# Patient Record
Sex: Male | Born: 1955 | Race: Black or African American | Hispanic: No | Marital: Married | Smoking: Never smoker
Health system: Southern US, Community
[De-identification: ages and names within clinical notes are randomized; demographics above are authoritative.]

## PROBLEM LIST (undated history)

## (undated) DIAGNOSIS — I10 Essential (primary) hypertension: Secondary | ICD-10-CM

## (undated) HISTORY — DX: Essential (primary) hypertension: I10

---

## 2006-06-02 ENCOUNTER — Emergency Department (HOSPITAL_COMMUNITY): Admission: EM | Admit: 2006-06-02 | Discharge: 2006-06-02 | Payer: Self-pay | Admitting: Emergency Medicine

## 2006-12-04 ENCOUNTER — Emergency Department (HOSPITAL_COMMUNITY): Admission: EM | Admit: 2006-12-04 | Discharge: 2006-12-04 | Payer: Self-pay | Admitting: Emergency Medicine

## 2007-06-10 ENCOUNTER — Emergency Department (HOSPITAL_COMMUNITY): Admission: EM | Admit: 2007-06-10 | Discharge: 2007-06-10 | Payer: Self-pay | Admitting: Emergency Medicine

## 2007-10-25 ENCOUNTER — Emergency Department (HOSPITAL_COMMUNITY): Admission: EM | Admit: 2007-10-25 | Discharge: 2007-10-25 | Payer: Self-pay | Admitting: Emergency Medicine

## 2009-10-16 ENCOUNTER — Emergency Department (HOSPITAL_COMMUNITY): Admission: EM | Admit: 2009-10-16 | Discharge: 2009-10-16 | Payer: Self-pay | Admitting: Emergency Medicine

## 2010-12-23 LAB — CBC
HCT: 39.2 % (ref 39.0–52.0)
Hemoglobin: 13.6 g/dL (ref 13.0–17.0)
MCHC: 34.6 g/dL (ref 30.0–36.0)
MCV: 94.9 fL (ref 78.0–100.0)
RBC: 4.13 MIL/uL — ABNORMAL LOW (ref 4.22–5.81)
RDW: 13.4 % (ref 11.5–15.5)
WBC: 7.3 10*3/uL (ref 4.0–10.5)

## 2010-12-23 LAB — DIFFERENTIAL
Basophils Absolute: 0 10*3/uL (ref 0.0–0.1)
Lymphocytes Relative: 14 % (ref 12–46)
Monocytes Absolute: 0.7 10*3/uL (ref 0.1–1.0)

## 2010-12-23 LAB — POCT I-STAT, CHEM 8
BUN: 13 mg/dL (ref 6–23)
Chloride: 101 mEq/L (ref 96–112)
Creatinine, Ser: 1.5 mg/dL (ref 0.4–1.5)
Glucose, Bld: 104 mg/dL — ABNORMAL HIGH (ref 70–99)
HCT: 43 % (ref 39.0–52.0)
Potassium: 4.3 mEq/L (ref 3.5–5.1)

## 2011-01-29 ENCOUNTER — Inpatient Hospital Stay (HOSPITAL_COMMUNITY)
Admission: EM | Admit: 2011-01-29 | Discharge: 2011-01-31 | DRG: 143 | Disposition: A | Discharge status: Home / Self Care | Payer: BC Managed Care – PPO | Attending: Internal Medicine | Admitting: Internal Medicine

## 2011-01-29 ENCOUNTER — Emergency Department (HOSPITAL_COMMUNITY): Payer: BC Managed Care – PPO

## 2011-01-29 DIAGNOSIS — Z860100 Personal history of colon polyps, unspecified: Secondary | ICD-10-CM

## 2011-01-29 DIAGNOSIS — F17200 Nicotine dependence, unspecified, uncomplicated: Secondary | ICD-10-CM | POA: Diagnosis present

## 2011-01-29 DIAGNOSIS — E669 Obesity, unspecified: Secondary | ICD-10-CM | POA: Diagnosis present

## 2011-01-29 DIAGNOSIS — R0789 Other chest pain: Principal | ICD-10-CM | POA: Diagnosis present

## 2011-01-29 DIAGNOSIS — I10 Essential (primary) hypertension: Secondary | ICD-10-CM | POA: Diagnosis present

## 2011-01-29 DIAGNOSIS — E785 Hyperlipidemia, unspecified: Secondary | ICD-10-CM | POA: Diagnosis present

## 2011-01-29 LAB — DIFFERENTIAL
Basophils Absolute: 0.1 10*3/uL (ref 0.0–0.1)
Basophils Relative: 1 % (ref 0–1)
Eosinophils Absolute: 0.2 10*3/uL (ref 0.0–0.7)
Eosinophils Relative: 3 % (ref 0–5)
Lymphs Abs: 2.7 10*3/uL (ref 0.7–4.0)
Monocytes Relative: 5 % (ref 3–12)
Neutrophils Relative %: 44 % (ref 43–77)

## 2011-01-29 LAB — CBC
MCH: 31.8 pg (ref 26.0–34.0)
MCV: 93.2 fL (ref 78.0–100.0)
WBC: 5.7 10*3/uL (ref 4.0–10.5)

## 2011-01-29 LAB — LIPID PANEL
LDL Cholesterol: 156 mg/dL — ABNORMAL HIGH (ref 0–99)
Total CHOL/HDL Ratio: 5.3 RATIO
Triglycerides: 222 mg/dL — ABNORMAL HIGH (ref <150)

## 2011-01-29 LAB — COMPREHENSIVE METABOLIC PANEL
Albumin: 3.9 g/dL (ref 3.5–5.2)
Alkaline Phosphatase: 58 U/L (ref 39–117)
CO2: 27 mEq/L (ref 19–32)
Calcium: 9 mg/dL (ref 8.4–10.5)
GFR calc Af Amer: >60 mL/min (ref >60)
Glucose, Bld: 96 mg/dL (ref 70–99)
Total Protein: 6.8 g/dL (ref 6.0–8.3)

## 2011-01-29 LAB — POCT CARDIAC MARKERS: Myoglobin, poc: 175 ng/mL (ref 12–200)

## 2011-01-29 NOTE — H&P (Signed)
NAME:  KADARIOUS, DIKES NO.:  [id number]  MEDICAL RECORD NO.:  [id number]           PATIENT TYPE:  E  LOCATION:  [facility]                         FACILITY:  [facility]  PHYSICIAN:  Talmage Nap, MD  DATE OF BIRTH:  17-Aug-1956  DATE OF ADMISSION:  01/29/2011 DATE OF DISCHARGE:                             HISTORY & PHYSICAL   PRIMARY CARE PHYSICIAN:  Unassigned.  History obtainable from the patient and the patient's sister.  CHIEF COMPLAINT:  Chest pain at rest, which started at about 2:30 p.m. at work today, January 29, 2011.  The patient is a 55 year old obese very pleasant African American male with history of hypertension, hyperlipidemia presenting to the emergency room with sudden onset of chest pain, which started at work.  The patient claimed [place] and suddenly developed chest pain located in the precordia as well as in the recess sternal region.  Pain was described as pressure-like, 10/10 in intensity, and was radiating to the left arm.  It was said to be associated with shortness of breath.  The patient however denied any history of diaphoresis.  He denied any nausea.  He denied any vomiting. He denied any headaches.  He denied any fever.  He denied any chills. Denied any rigor.  The chest pain however persisted and the patient presented to the emergency room.  In the ED, the patient was given 2 tablets of nitro and the pain resolved.  The patient claimed that this is about a third episode of the chest pain.  First episode was about 4 months ago and the third episode was when he was in the emergency room awaiting evaluation.  After evaluating the patient, he was advised to be admitted for further workup and stabilization.  PAST MEDICAL HISTORY:  Positive for, 1. Hypertension. 2. Hyperlipidemia. 3. Obesity.  PAST SURGICAL HISTORY: 1. Ventral hernia repair. 2. Colonoscopy status post  polypectomy.  PREADMISSION MEDS: 1. Hydrochlorothiazide 25 mg one p.o. daily. 2. Prilosec (omeprazole) 10 mg over-the-counter one p.o. daily. 3. Prozac (fluoxetine) 20 mg p.o. daily.  He has no known allergies.  SOCIAL HISTORY:  The patient smokes once a week for unspecified duration of time.  Occasionally takes alcohol.  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  REVIEW OF SYSTEMS:  The patient complained about headache, most likely secondary to the nitroglycerin that was given.  He denies any nausea or vomiting.  No fever.  No chills.  No rigor.  Chest pain has abated. Denies any cough.  No abdominal discomfort.  No diarrhea or hematochezia.  No dysuria or hematuria.  No swelling of the lower extremity.  No intolerance to heat or cold and no neuropsychiatric disorder.  PHYSICAL EXAMINATION:  GENERAL:  On examination, very pleasant middle- aged man, obese, not in any respiratory distress. VITAL SIGNS:  Blood pressure is 149/102, pulse is 89, respiratory rate 22, temperature is 98.8. HEENT:  Pupils are reactive to light and extraocular muscles are intact. NECK:  No jugular venous distention.  No carotid bruit.  No lymphadenopathy. CHEST:  Clear to auscultation. HEART:  Heart sounds are 1 and 2. ABDOMEN:  Soft, obese, and nontender.  Prominent suprapubic transverse scar.  Liver, spleen tip not palpable.  Bowel sounds are positive. EXTREMITIES:  No pedal edema. NEUROLOGIC:  Nonfocal. MUSCULOSKELETAL:  Unremarkable. SKIN:  Normal turgor.  LABORATORY DATA:  First set of cardiac markers; troponin-I less than 0.05, CK-MB less than 1.0.  Hematological indices showed WBC of 5.7, hemoglobin of 14.1, hematocrit of 41.4, MCV of 93.2 with a platelet count of 279.  Chemistry, most likely not done.  EKG showed normal sinus rhythm with no ST-wave changes.  Imaging studies done include chest x-ray, which was essentially  normal.  IMPRESSION: 1. Chest pain, questionable angina. 2. Hypertension. 3. Hyperlipidemia. 4. Obesity. 5. Family history of diabetes mellitus. 6. Recurrent episodes of chest pain.  PLAN:  To admit the patient to telemetry.  The patient will be saline locked.  He will be given nitroglycerin 0.4 mg sublingual p.r.n. for chest pain followed with Tylenol 100 mg p.o. t.i.d. p.r.n. for headache associated with the nitroglycerin.  He will be on O2 via nasal cannula 3 L per minute, aspirin 325 mg p.o. daily, morphine 2 mg IV q.4 p.r.n. for chest pain, Zocor 20 mg p.o. daily, lisinopril 10 mg p.o. daily, Lopressor 50 mg p.o. b.i.d., he will also be on Protonix 40 mg p.o. daily for GI prophylaxis and Lovenox 40 mg subcu q.24 for DVT prophylaxis.  Further labs to be ordered on this patient will include cardiac enzymes q.6 x3, lipid panel, hemoglobin A1c, thyroid panel, which include TSH, T3, and T4 and chemistries, CMP stat, will be repeated in a.m., CBCD will also be repeated in a.m., and the patient will also have a 2-D echo done.  Finally because of recurrent episodes of chest pain it is  important for the patient to have a Cardiolite stress test done in this admission. The patient will be followed and evaluated on day-to-day basis.     Talmage Nap, MD     CN/MEDQ  D:  01/29/2011  T:  01/29/2011  Job:  [id number]  Electronically Signed by Talmage Nap  on 01/29/2011 09:07:55 PM

## 2011-01-30 DIAGNOSIS — R079 Chest pain, unspecified: Secondary | ICD-10-CM

## 2011-01-30 DIAGNOSIS — R072 Precordial pain: Secondary | ICD-10-CM

## 2011-01-30 LAB — DIFFERENTIAL
Basophils Absolute: 0.1 10*3/uL (ref 0.0–0.1)
Basophils Relative: 1 % (ref 0–1)
Lymphocytes Relative: 46 % (ref 12–46)
Lymphs Abs: 3.3 10*3/uL (ref 0.7–4.0)
Monocytes Absolute: 0.5 10*3/uL (ref 0.1–1.0)
Monocytes Relative: 7 % (ref 3–12)
Neutro Abs: 3.2 10*3/uL (ref 1.7–7.7)
Neutrophils Relative %: 44 % (ref 43–77)

## 2011-01-30 LAB — COMPREHENSIVE METABOLIC PANEL
ALT: 32 U/L (ref 0–53)
Albumin: 3.7 g/dL (ref 3.5–5.2)
Alkaline Phosphatase: 54 U/L (ref 39–117)
GFR calc Af Amer: >60 mL/min (ref >60)
GFR calc non Af Amer: >60 mL/min (ref >60)
Total Bilirubin: 0.6 mg/dL (ref 0.3–1.2)
Total Protein: 6.5 g/dL (ref 6.0–8.3)

## 2011-01-30 LAB — HEMOGLOBIN A1C: Mean Plasma Glucose: 126 mg/dL — ABNORMAL HIGH (ref <117)

## 2011-01-30 LAB — TSH: TSH: 1.087 u[IU]/mL (ref 0.350–4.500)

## 2011-01-30 LAB — CBC
HCT: 39.9 % (ref 39.0–52.0)
RDW: 13 % (ref 11.5–15.5)

## 2011-01-30 LAB — CARDIAC PANEL(CRET KIN+CKTOT+MB+TROPI)
Relative Index: 0.4 (ref 0.0–2.5)
Total CK: 245 U/L — ABNORMAL HIGH (ref 7–232)
Total CK: 215 U/L (ref 7–232)
Troponin I: 0.02 ng/mL (ref 0.00–0.06)

## 2011-01-31 ENCOUNTER — Inpatient Hospital Stay (HOSPITAL_COMMUNITY): Payer: BC Managed Care – PPO

## 2011-01-31 DIAGNOSIS — R079 Chest pain, unspecified: Secondary | ICD-10-CM

## 2011-01-31 LAB — CBC
HCT: 40.5 % (ref 39.0–52.0)
MCH: 33.1 pg (ref 26.0–34.0)
MCV: 93.8 fL (ref 78.0–100.0)
Platelets: 290 10*3/uL (ref 150–400)
RDW: 13 % (ref 11.5–15.5)
WBC: 7.8 10*3/uL (ref 4.0–10.5)

## 2011-01-31 LAB — BASIC METABOLIC PANEL
BUN: 14 mg/dL (ref 6–23)
CO2: 28 mEq/L (ref 19–32)
Calcium: 9.6 mg/dL (ref 8.4–10.5)
Glucose, Bld: 101 mg/dL — ABNORMAL HIGH (ref 70–99)
Potassium: 4.3 mEq/L (ref 3.5–5.1)
Sodium: 139 mEq/L (ref 135–145)

## 2011-01-31 MED ORDER — TECHNETIUM TC 99M TETROFOSMIN IV KIT
10.0000 | PACK | Freq: Once | INTRAVENOUS | Status: AC | PRN
  Administered 2011-01-31: 10 via INTRAVENOUS

## 2011-01-31 MED ORDER — TECHNETIUM TC 99M TETROFOSMIN IV KIT
30.0000 | PACK | Freq: Once | INTRAVENOUS | Status: AC | PRN
  Administered 2011-01-31: 30 via INTRAVENOUS

## 2011-02-01 ENCOUNTER — Inpatient Hospital Stay (HOSPITAL_COMMUNITY): Payer: BC Managed Care – PPO

## 2011-02-12 NOTE — Consult Note (Signed)
NAME:  KENY, DONALD NO.:  [id number]  MEDICAL RECORD NO.:  [id number]           PATIENT TYPE:  I  LOCATION:  3740                         FACILITY:  [facility]  PHYSICIAN:  Vesta Mixer, M.D. DATE OF BIRTH:  06/29/1956  DATE OF CONSULTATION:  01/30/2011 DATE OF DISCHARGE:                                CONSULTATION   Prestyn is a 55 year old gentleman with a history of hypertension, hyperlipidemia, and obesity.  He was admitted yesterday with episodes of chest pain.  The patient reports having episodes of chest pain for the past several months.  These would typically occur several days after he worked out, that was more consistent with the muscular type pain.  Yesterday, he had a severe onset of this chest pain.  It tend to get worse if he sat up and twisted his torso.  There was a possible pleuritic component to the chest pain.  The patient also noticed some worsening when he got up and walked around.  He presented to the emergency room and is feeling a little bit better today.  The pain is described as a pressure-like sensation that also is very sharp.  He is tender around the left side of his chest.  It lasted intermittently for several hours.  CURRENT MEDICATIONS: 1. Hydrochlorothiazide 25 mg a day. 2. Prilosec 10 mg over the counter once a day. 3. Prozac 20 mg a day.  He has no known drug allergies.  SOCIAL HISTORY:  The patient smokes cigar once a week.  He occasionally drinks alcohol.  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  FAMILY HISTORY:  Positive for diabetes.  REVIEW OF SYSTEMS:  As noted in the HPI.  All other systems are negative.  PHYSICAL EXAMINATION:  GENERAL:  He is a middle-aged gentleman, in no acute distress.  He is alert and oriented x3, and his mood and affect are normal. VITAL SIGNS:  He is afebrile, his heart rate is 81, his blood pressure is 106/73.  His O2 saturations 97% on room air. NECK:  2+ carotids.   He has no bruits, no JVD, no thyromegaly. HEENT:  Sclerae are nonicteric.  His mucous membranes are moist. BACK:  His back is nontender. HEART:  Regular rate, S1 and S2.  There is some possible chest wall tenderness.  His PMI is nondisplaced. ABDOMEN:  Good bowel sounds and is nontender.  There is no guarding or rebound.  There is no hepatosplenomegaly. EXTREMITIES:  He has good pulses.  There is no edema.  There is no palpable cords. NEUROLOGIC:  Nonfocal.  His cranial nerves II-XII are intact and his motor and sensory function are intact.  LABORATORY DATA:  His cardiac enzymes are negative x3.  His electrolytes and his CBC are unremarkable.  Mr. Zeiner presents with an episode of chest pain.  His EKG reveals normal sinus rhythm.  There are no ST or T-wave changes.  At this point, I would agree with scheduling a stress Myoview study.  I do not think that the patient needs cardiac catheterization at this point.  We will schedule a stress  Myoview tomorrow.  We had a discussion about smoking cessation.  I have also asked him to get on a good diet and to lose some weight.  He should be able to go home tomorrow if his Myoview is normal.     Vesta Mixer, M.D.     PJN/MEDQ  D:  01/30/2011  T:  01/31/2011  Job:  [id number]  Electronically Signed by Kristeen Miss M.D. on 02/12/2011 06:18:36 PM

## 2011-02-19 NOTE — Consult Note (Signed)
NAME:  Keith Patterson, Keith Patterson NO.:  [id number]   MEDICAL RECORD NO.:  [id number]          PATIENT TYPE:  EMS   LOCATION:  ED                           FACILITY:  [facility]   PHYSICIAN:  Courtney Paris, M.D.DATE OF BIRTH:  1955-11-05   DATE OF CONSULTATION:  06/10/2007  DATE OF DISCHARGE:                                 CONSULTATION   ER CONSULT   REASON FOR CONSULTATION:  Hematuria.   BRIEF HISTORY:  This 55 year old black male was admitted through the  emergency room where he had gross hematuria, today,with clots and A lot  of pain with urination.  It happened 3 weeks ago; it lasted for 2 or 3  voidings; and then it seemed to clear.  He is not a very good  historian,but apparently 2-1/2 years ago and [place] he had a  fractured penis that was repaired, acutely, with a suprapubic tube; and  a circumferential degloving-type circumcision incision to repair the  urethra.  About 2-3 months, postoperatively, his stream was slow.  He  had a cystoscopy and apparently a urethral dilation in the office; and  his stream has been good since then.  He had gonorrhea several times  when he was a teenager.  He has been voiding well with a good stream up  until today.  He was given some pain medicine, and had trouble voiding;  but then finally was able to void a large amount of several hundred mL a  few minutes ago.  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  He is here with his wife and has  9-1/2-and-44-1/2-year-old children.   MEDICATION:  Prilosec.   ALLERGIES:  None.   No other operations.  He was said to have a little fever in the  emergency room had some chills yesterday, but did not have his  temperature taken.   He inhales cigars, about 2-3 per day.  Used to smoke cigarettes but  switched to cigars.  He had a normal PSA 2 years ago by history.  Also  has been told to have elevated liver function tests.  He does drink some  alcohol, but has never been jaundiced or had hepatitis.  No  history of  kidney stones.  No recent urinary tract infections.   REVIEW OF SYSTEMS:  A 12-point review of systems is negative except as  above.  No cardiac or pulmonary symptomatology.  He does not have  hypertension or diabetes.  No abdominal pain, but does have history of  ulcers.  He treats himself with Prilosec only.  Bowels are normal.  No  melena.  No arthritis or joint disease.  Neuropsychiatric review  negative.  12-point review otherwise normal.   FAMILY HISTORY AND SOCIAL HISTORY:  As above.  He has a mother and  father here in [place] who are in good health.  His mother has  diabetes and hypertension.  His dad has hypertension.  He has one sister  and 3 half-siblings all also in good health.   LABS:  His white count is 11,000.  His hematocrit is 41.3, platelets  258.  His electrolytes are normal.  His creatinine 1.46, BUN 8, albumin  is 4.2.   PHYSICAL EXAMINATION:  VITAL SIGNS:  His temperature on admission was  9.0; blood pressure 122/32; pulse 119, respirations 24.  About an hour  later, his temperature was taken and it was 103.4; blood pressure  108/18, pulse 108.  About 15 minutes ago his temperature was taken,  again, and it was 98.6.  GENERAL:  He is a heavy-set black male in no acute distress.  He is a  little bit rambling in his history, but lying quietly in bed.  HEENT:  Clear.  NECK:  Supple.  CHEST:  Clear.  ABDOMEN:  Soft, benign.  No longer distended.  GENITOURINARY:  He has a Pfannenstiel-type suprapubic scar.  His penis  is circumcised.  Bilaterally descended testes without tenderness.  Prostate small, and benign.  No perineal scars can be seen.  EXTREMITIES:  Negative.  No edema.  Good distal pulses.   IMPRESSION:  1. Hematuria.  2. History of fractured penis with repair.  3. History of urethral stricture.  4. Possible urinary tract infection.   RECOMMENDATIONS:  Culture the urine and will put him on some Cipro; and  I gave him some Rocephin 1  gram IV now.  If he is still able to void  after this, then I will send him home on antibiotics, and have him come  back to the office in a week or so for further evaluation and followup.  As long as he is able to void, I think, I would like to cover him with  antibiotic before any instrumentation is done.  The nurses in the ER  tried to pass a catheter on one occasion, but he voided after that; and  they were not able to get into the bladder.      Courtney Paris, M.D.  Electronically Signed     HMK/MEDQ  D:  06/10/2007  T:  06/11/2007  Job:  [id number]

## 2011-02-28 NOTE — Discharge Summary (Signed)
  NAME:  Keith Patterson, Keith Patterson               ACCOUNT NO.:  [id number]  MEDICAL RECORD NO.:  [id number]           PATIENT TYPE:  I  LOCATION:  3740                         FACILITY:  [facility]  PHYSICIAN:  Vaunda Gutterman I Analina Filla, MD      DATE OF BIRTH:  [birth year]  DATE OF ADMISSION:  01/29/2011 DATE OF DISCHARGE:  01/31/2011                              DISCHARGE SUMMARY   DISCHARGE DIAGNOSES: 1. Atypical chest pain, status post stress test, which was negative. 2. Hyperlipidemia. 3. Obesity. 4. Hypertension.  PROCEDURES: 1. Myocardial stress test, no evidence of stress induced ischemia,     ejection fraction of 70%. 2. Chest x-ray, no active cardiopulmonary disease.  HISTORY OF PRESENT ILLNESS:  This is a 55 year old man with history of hypertension, hyperlipidemia, and obesity.  He presented with chest pain for the past several months.  He presented with severe onset of his chest pain.  It tends to get worse, if he sat up and twisted his torso. Presented to the emergency room, have vital signs of blood pressure 106/73, and satting 97% on room air.  The patient did not feel much shortness of breath, respiratory rate was 22.  On examination, his lungs seem clear to auscultation and heart was S1 and S2 with no added sound. His EKG was normal sinus rhythm.  Cardiology consulted to evaluate his chest pain and the patient underwent myo stress test, which was negative, report was negative.  The patient has no further chest pain.  DISCHARGE MEDICATIONS: 1. Zocor 20 mg p.o. at bedtime. 2. Hydrochlorothiazide 25 mg daily. 3. Prilosec 10 mg p.o. daily. 4. Prozac 20 mg p.o. daily.  The patient was advised to follow up with his MD within 1 week.     Chairty Toman Bosie Helper, MD     HIE/MEDQ  D:  02/27/2011  T:  02/28/2011  Job:  [id number]  Electronically Signed by Ebony Cargo MD on 02/28/2011 09:43:03 AM

## 2011-06-27 LAB — COMPREHENSIVE METABOLIC PANEL
ALT: 26
AST: 19
Albumin: 3 — ABNORMAL LOW
Alkaline Phosphatase: 61
Chloride: 105
GFR calc Af Amer: >60
Potassium: 3.8
Sodium: 138
Total Bilirubin: 0.7

## 2011-06-27 LAB — DIFFERENTIAL
Basophils Absolute: 0
Eosinophils Absolute: 0.1
Eosinophils Relative: 1
Lymphocytes Relative: 18
Monocytes Absolute: 0.4

## 2011-06-27 LAB — URINALYSIS, ROUTINE W REFLEX MICROSCOPIC
Bilirubin Urine: NEGATIVE
Ketones, ur: NEGATIVE
Specific Gravity, Urine: 1.015
pH: 5.5

## 2011-06-27 LAB — CBC
Platelets: 266
RBC: 4.14 — ABNORMAL LOW
WBC: 7

## 2011-06-27 LAB — URINE MICROSCOPIC-ADD ON

## 2011-07-19 LAB — URINALYSIS, ROUTINE W REFLEX MICROSCOPIC
Ketones, ur: 15 — AB
Specific Gravity, Urine: 1.023
Urobilinogen, UA: 1

## 2011-07-19 LAB — DIFFERENTIAL
Basophils Absolute: 0
Basophils Relative: 0
Neutro Abs: 10.6 — ABNORMAL HIGH
Neutrophils Relative %: 96 — ABNORMAL HIGH

## 2011-07-19 LAB — CBC
HCT: 41.3
Hemoglobin: 14.6
RDW: 12.8
WBC: 11 — ABNORMAL HIGH

## 2011-07-19 LAB — URINE CULTURE: Colony Count: 2000

## 2011-07-19 LAB — COMPREHENSIVE METABOLIC PANEL
Alkaline Phosphatase: 62
BUN: 8
Chloride: 104
Glucose, Bld: 125 — ABNORMAL HIGH
Potassium: 4.3
Total Bilirubin: 1.6 — ABNORMAL HIGH

## 2011-07-19 LAB — URINE MICROSCOPIC-ADD ON

## 2011-12-13 ENCOUNTER — Other Ambulatory Visit: Payer: Self-pay | Admitting: Internal Medicine

## 2021-01-29 ENCOUNTER — Other Ambulatory Visit: Payer: Self-pay | Admitting: Registered Nurse

## 2021-01-29 ENCOUNTER — Ambulatory Visit
Admission: RE | Admit: 2021-01-29 | Discharge: 2021-01-29 | Disposition: A | Discharge status: Home / Self Care | Payer: Medicare PPO | Source: Ambulatory Visit | Attending: Registered Nurse | Admitting: Registered Nurse

## 2021-01-29 DIAGNOSIS — M545 Low back pain, unspecified: Secondary | ICD-10-CM

## 2022-07-26 ENCOUNTER — Encounter (HOSPITAL_COMMUNITY): Payer: Self-pay

## 2022-07-26 ENCOUNTER — Ambulatory Visit (HOSPITAL_COMMUNITY)
Admission: EM | Admit: 2022-07-26 | Discharge: 2022-07-26 | Disposition: A | Discharge status: Home / Self Care | Payer: Medicare PPO | Attending: Family Medicine | Admitting: Family Medicine

## 2022-07-26 DIAGNOSIS — L0291 Cutaneous abscess, unspecified: Secondary | ICD-10-CM

## 2022-07-26 DIAGNOSIS — L02811 Cutaneous abscess of head [any part, except face]: Secondary | ICD-10-CM

## 2022-07-26 MED ORDER — HYDROCODONE-ACETAMINOPHEN 5-325 MG PO TABS: 2.0000 | ORAL_TABLET | Freq: Four times a day (QID) | ORAL | 0 refills | Status: AC | PRN

## 2022-07-26 MED ORDER — CEFTRIAXONE SODIUM 1 G IJ SOLR
INTRAMUSCULAR | Status: AC
  Filled 2022-07-26: qty 10

## 2022-07-26 MED ORDER — KETOROLAC TROMETHAMINE 30 MG/ML IJ SOLN
INTRAMUSCULAR | Status: AC
  Filled 2022-07-26: qty 1

## 2022-07-26 MED ORDER — AMOXICILLIN-POT CLAVULANATE 875-125 MG PO TABS: 1.0000 | ORAL_TABLET | Freq: Two times a day (BID) | ORAL | 0 refills | Status: AC

## 2022-07-26 MED ORDER — LIDOCAINE HCL (PF) 1 % IJ SOLN
INTRAMUSCULAR | Status: AC
  Filled 2022-07-26: qty 2

## 2022-07-26 MED ORDER — KETOROLAC TROMETHAMINE 30 MG/ML IJ SOLN
30.0000 mg | Freq: Once | INTRAMUSCULAR | Status: AC
  Administered 2022-07-26: 30 mg via INTRAMUSCULAR

## 2022-07-26 MED ORDER — CEFTRIAXONE SODIUM 1 G IJ SOLR
1.0000 g | Freq: Once | INTRAMUSCULAR | Status: AC
  Administered 2022-07-26: 1 g via INTRAMUSCULAR

## 2022-07-26 NOTE — Discharge Instructions (Addendum)
You were seen today for an abscess at the back of the scalp.  I have given you a shot of an antibiotic and pain medication today.  I have sent out an oral antibiotic to your pharmacy.  Please start this this evening. I have also given you a short supply of pain medication.  I recommend you use a heating pad to the back of the scalp as well.  Please return if not improving  over the next several days.

## 2022-07-26 NOTE — ED Provider Notes (Signed)
[facility]    CSN: [id number] Arrival date & time: 07/26/22  1206      History   Chief Complaint Chief Complaint  Patient presents with   Abscess    HPI Keith Patterson is a 66 y.o. male.   Patient is here for abcess at the back of the head x 2 weeks.   Has some swollen Lns to the side of the neck.  No fevers.  Using hot/cold packs without much help.   Past Medical History:  Diagnosis Date   Hypertension     There are no problems to display for this patient.   History reviewed. No pertinent surgical history.     Home Medications    Prior to Admission medications   Medication Sig Start Date End Date Taking? Authorizing Provider  amLODipine (NORVASC) 10 MG tablet Take 10 mg by mouth daily. 06/20/22   [provider]  omeprazole (PRILOSEC) 20 MG capsule Take 20 mg by mouth daily. 06/18/22   [provider]    Family History History reviewed. No pertinent family history.  Social History Social History   Tobacco Use   Smoking status: Never   Smokeless tobacco: Never  Substance Use Topics   Alcohol use: Yes    Comment: occ   Drug use: Not Currently     Allergies   Patient has no known allergies.   Review of Systems Review of Systems  Constitutional: Negative.   HENT: Negative.    Respiratory: Negative.    Cardiovascular: Negative.   Gastrointestinal: Negative.   Genitourinary: Negative.   Musculoskeletal: Negative.      Physical Exam Triage Vital Signs ED Triage Vitals  Enc Vitals Group     BP 07/26/22 1304 (!) 127/90     Pulse Rate 07/26/22 1304 93     Resp 07/26/22 1304 18     Temp 07/26/22 1304 99.3 F (37.4 C)     Temp Source 07/26/22 1304 Oral     SpO2 07/26/22 1304 99 %     Weight --      Height --      Head Circumference --      Peak Flow --      Pain Score 07/26/22 1306 10     Pain Loc --      Pain Edu? --      Excl. in GC? --    No data found.  Updated Vital Signs BP (!) 127/90 (BP  Location: Left Arm)   Pulse 93   Temp 99.3 F (37.4 C) (Oral)   Resp 18   SpO2 99%   Visual Acuity Right Eye Distance:   Left Eye Distance:   Bilateral Distance:    Right Eye Near:   Left Eye Near:    Bilateral Near:     Physical Exam Constitutional:      Appearance: Normal appearance.  Lymphadenopathy:     Cervical: Cervical adenopathy present.  Skin:    Comments: At the back of the scalp on the left, in the hair line, is a raised, red, tender lump;  hardened  Neurological:     General: No focal deficit present.     Mental Status: He is alert.  Psychiatric:        Mood and Affect: Mood normal.      UC Treatments / Results  Labs (all labs ordered are listed, but only abnormal results are displayed) Labs Reviewed - No data to display  EKG   Radiology No  results found.  Procedures Procedures (including critical care time)  Medications Ordered in UC Medications  cefTRIAXone (ROCEPHIN) injection 1 g (has no administration in time range)  ketorolac (TORADOL) 30 MG/ML injection 30 mg (has no administration in time range)    Initial Impression / Assessment and Plan / UC Course  I have reviewed the triage vital signs and the nursing notes.  Pertinent labs & imaging results that were available during my care of the patient were reviewed by me and considered in my medical decision making (see chart for details).  Patient was seen for an abscess at the back of his scalp.  Given the location I do not think this is ideal for incision and drainage.  Will treat with an antibiotic and pain medication for now.  If not improving then may need to return.    Final Clinical Impressions(s) / UC Diagnoses   Final diagnoses:  Abscess     Discharge Instructions      You were seen today for an abscess at the back of the scalp.  I have given you a shot of an antibiotic and pain medication today.  I have sent out an oral antibiotic to your pharmacy.  Please start this this  evening. I have also given you a short supply of pain medication.  I recommend you use a heating pad to the back of the scalp as well.  Please return if not improving  over the next several days.     ED Prescriptions     Medication Sig Dispense Auth. Provider   amoxicillin-clavulanate (AUGMENTIN) 875-125 MG tablet Take 1 tablet by mouth every 12 (twelve) hours. 14 tablet Jadrian Bulman, MD   HYDROcodone-acetaminophen (NORCO/VICODIN) 5-325 MG tablet Take 2 tablets by mouth every 6 (six) hours as needed for up to 5 days. 30 tablet Rondel Oh, MD      PDMP not reviewed this encounter.   Rondel Oh, MD 07/26/22 1330

## 2022-07-26 NOTE — ED Triage Notes (Signed)
Pt c/o abscess to lt side of back of his head for 3wks. States using hot packs and OTC meds with no relief. Denies drainage.

## 2022-10-26 IMAGING — CR DG LUMBAR SPINE 2-3V
3 series · 3 of 3 positions shown · non-contrast
Comparison: None.

CLINICAL DATA: Low back pain after twisting injury

EXAM:
LUMBAR SPINE - 2-3 VIEW

[t l-spine a.p.]
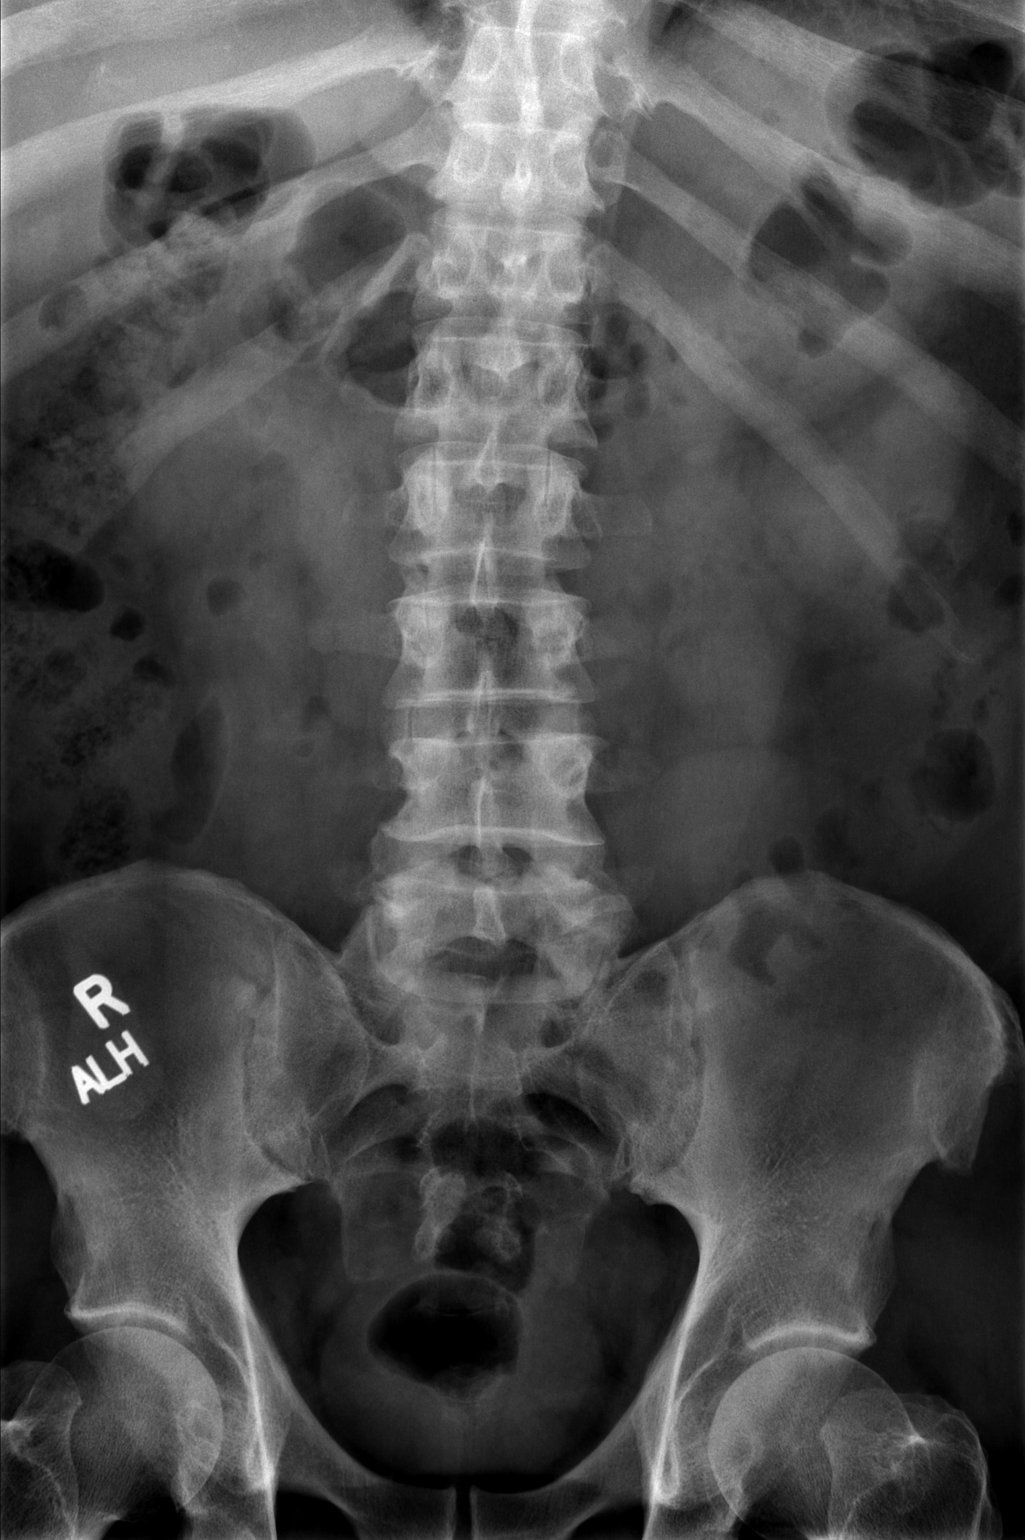

[t l-spine lat]
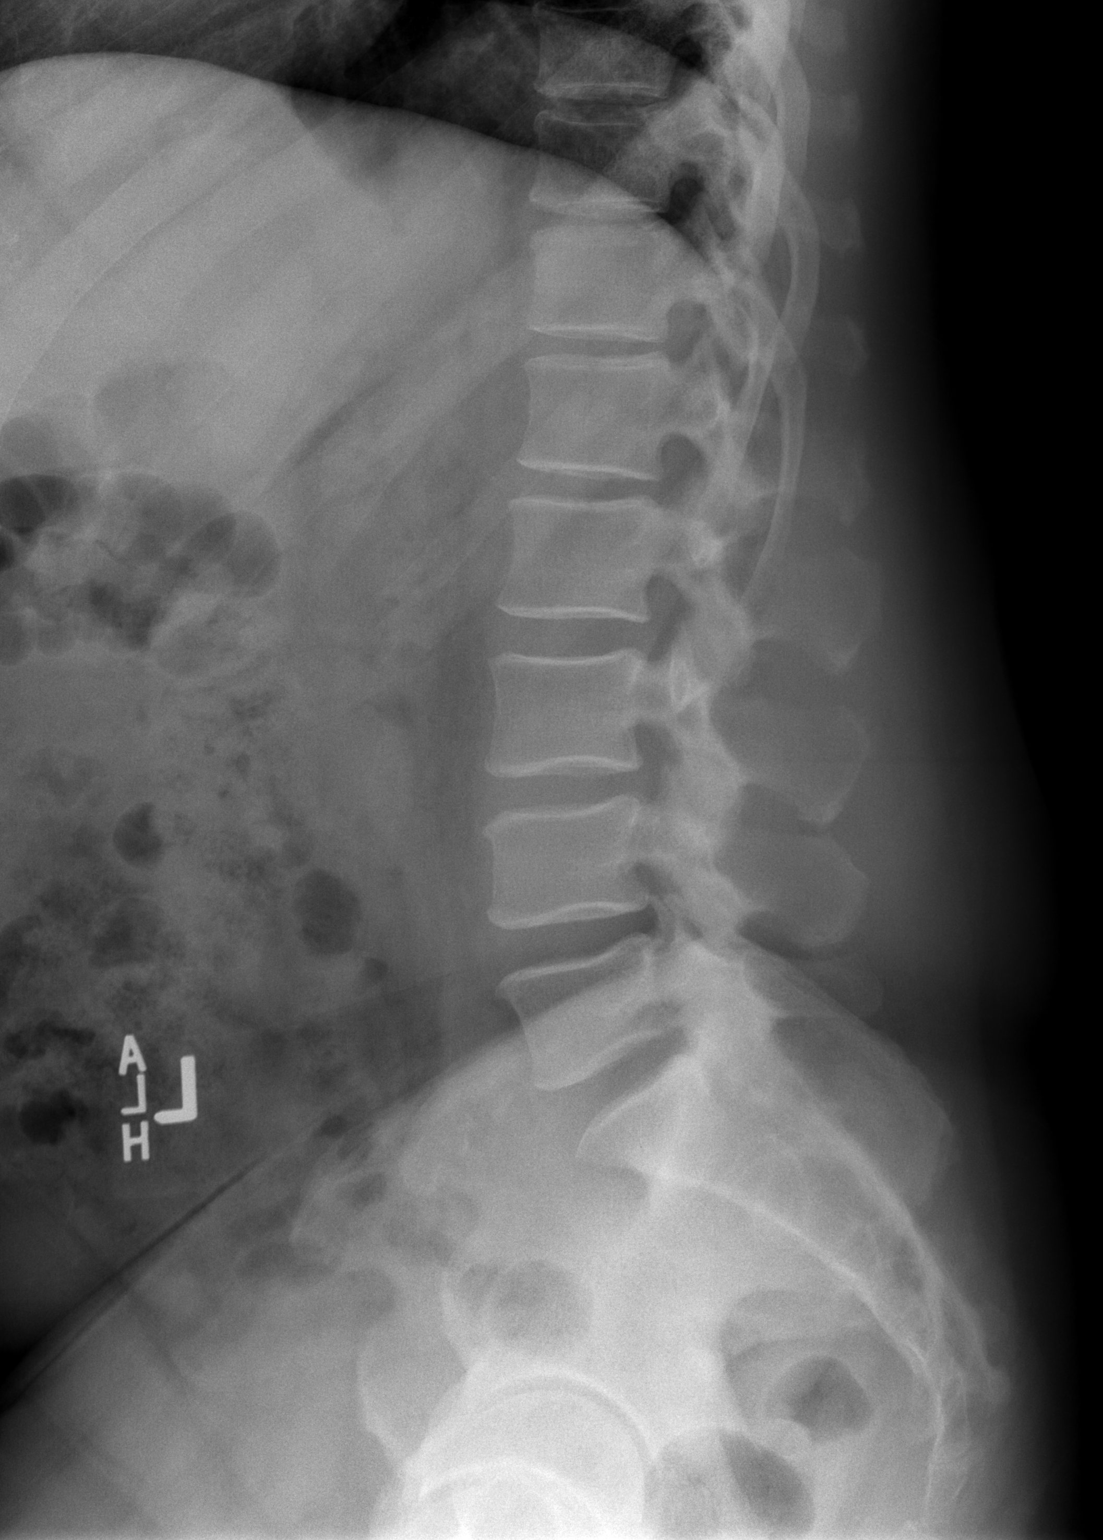

[t l-spine l5-s1 spot]
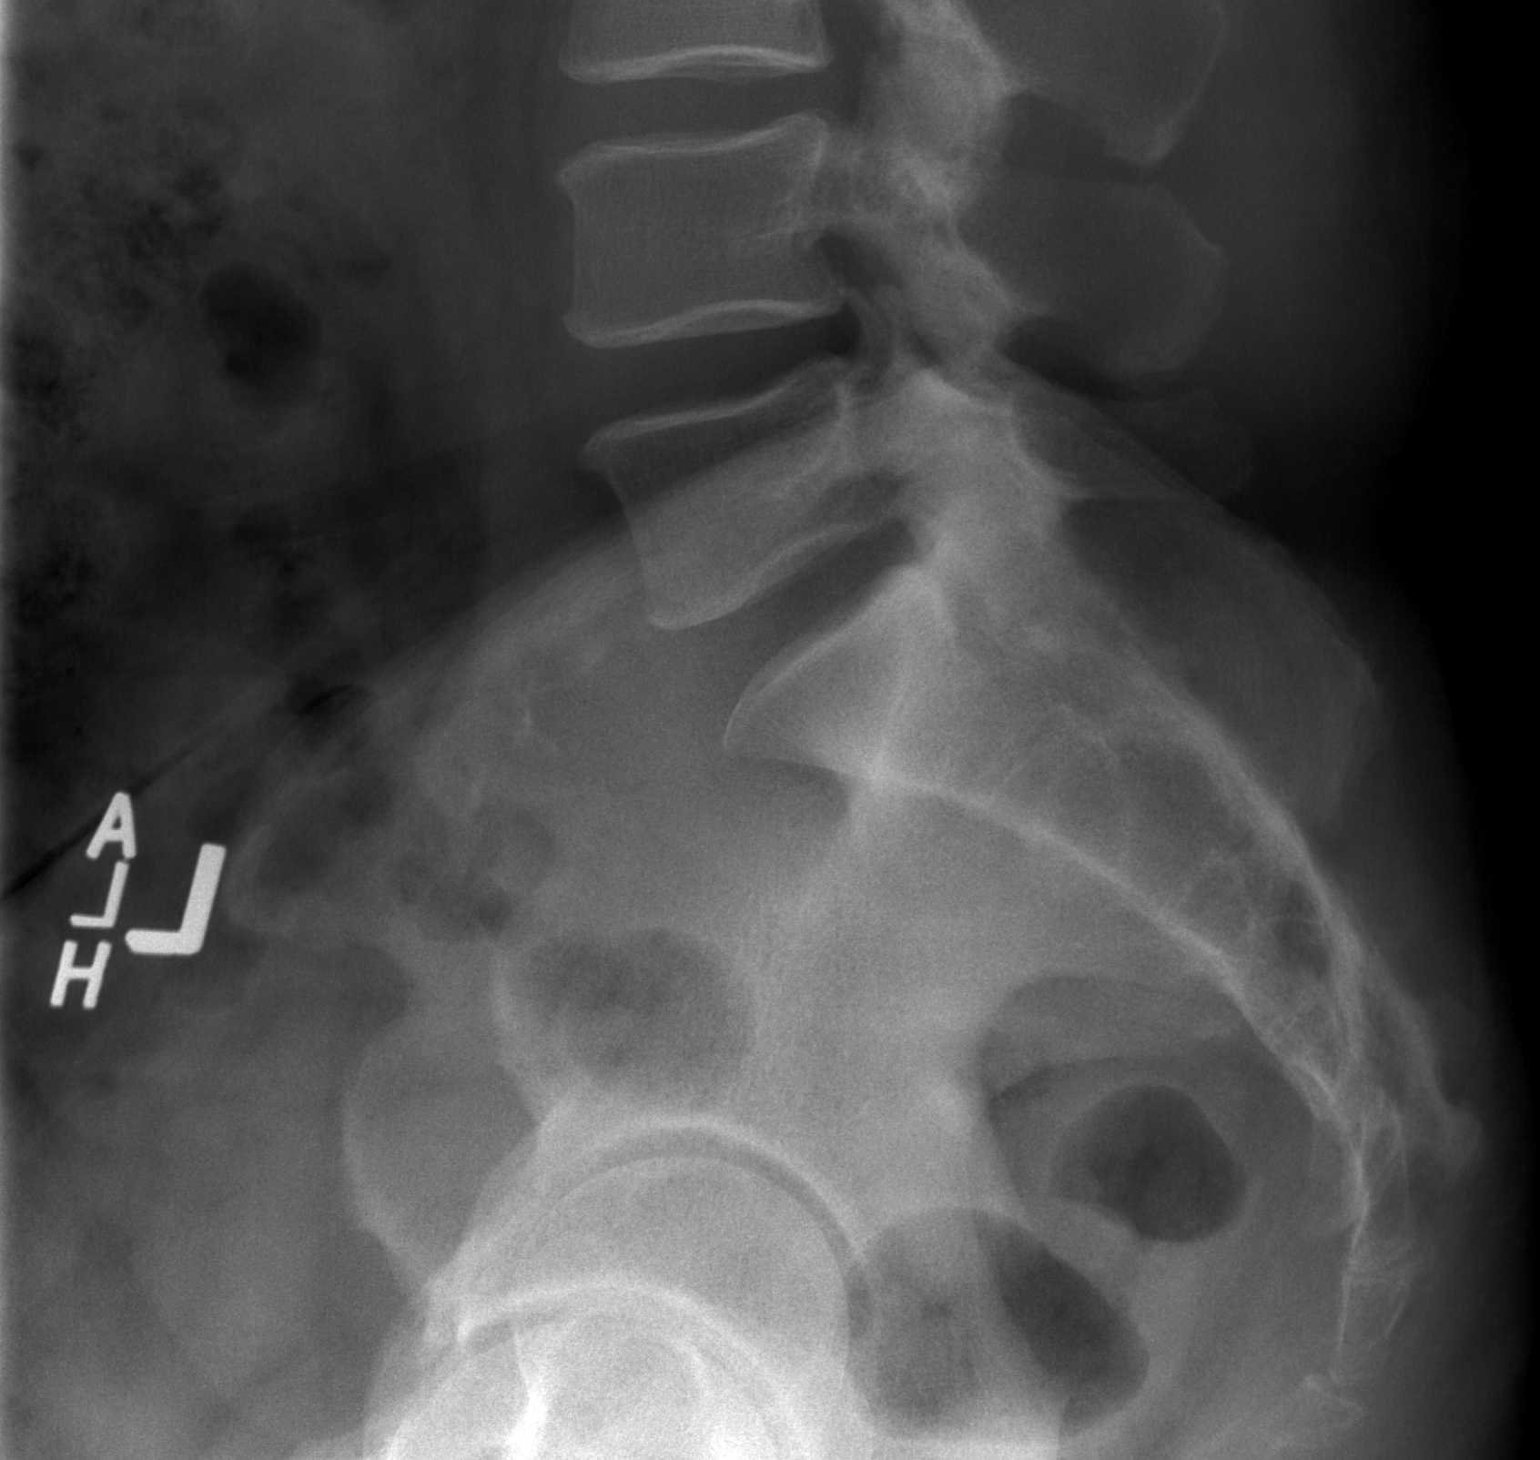

[3 of 3 positions shown; findings below may reference images not displayed]

FINDINGS: Five lumbar type vertebral bodies. Sacroiliac joints are symmetric.
Maintenance of vertebral body height and alignment. Intervertebral
disc heights are maintained.
IMPRESSION: No acute osseous abnormality.
